# Patient Record
Sex: Male | Born: 1985
Health system: Southern US, Community
[De-identification: ages and names within clinical notes are randomized; demographics above are authoritative.]

---

## 2003-01-12 ENCOUNTER — Emergency Department (HOSPITAL_COMMUNITY): Admission: EM | Admit: 2003-01-12 | Discharge: 2003-01-12 | Payer: Self-pay | Admitting: Emergency Medicine

## 2003-04-12 ENCOUNTER — Emergency Department (HOSPITAL_COMMUNITY): Admission: EM | Admit: 2003-04-12 | Discharge: 2003-04-12 | Payer: Self-pay | Admitting: Emergency Medicine

## 2003-08-10 ENCOUNTER — Emergency Department (HOSPITAL_COMMUNITY): Admission: EM | Admit: 2003-08-10 | Discharge: 2003-08-10 | Payer: Self-pay | Admitting: Emergency Medicine

## 2004-10-11 ENCOUNTER — Ambulatory Visit (HOSPITAL_COMMUNITY): Admission: RE | Admit: 2004-10-11 | Discharge: 2004-10-11 | Payer: Self-pay

## 2005-03-22 ENCOUNTER — Emergency Department (HOSPITAL_COMMUNITY): Admission: EM | Admit: 2005-03-22 | Discharge: 2005-03-22 | Payer: Self-pay | Admitting: *Deleted

## 2007-05-29 ENCOUNTER — Ambulatory Visit (HOSPITAL_COMMUNITY): Admission: RE | Admit: 2007-05-29 | Discharge: 2007-05-29 | Payer: Self-pay | Admitting: Orthopedic Surgery

## 2007-07-20 ENCOUNTER — Emergency Department (HOSPITAL_COMMUNITY): Admission: EM | Admit: 2007-07-20 | Discharge: 2007-07-20 | Payer: Self-pay | Admitting: Emergency Medicine

## 2008-03-27 ENCOUNTER — Emergency Department (HOSPITAL_COMMUNITY): Admission: EM | Admit: 2008-03-27 | Discharge: 2008-03-27 | Payer: Self-pay | Admitting: Emergency Medicine

## 2008-09-10 ENCOUNTER — Emergency Department (HOSPITAL_COMMUNITY): Admission: EM | Admit: 2008-09-10 | Discharge: 2008-09-10 | Payer: Self-pay | Admitting: Emergency Medicine

## 2008-09-13 ENCOUNTER — Ambulatory Visit (HOSPITAL_COMMUNITY): Admission: RE | Admit: 2008-09-13 | Discharge: 2008-09-13 | Payer: Self-pay | Admitting: Orthopedic Surgery

## 2008-09-16 ENCOUNTER — Ambulatory Visit (HOSPITAL_COMMUNITY): Admission: RE | Admit: 2008-09-16 | Discharge: 2008-09-17 | Payer: Self-pay | Admitting: Orthopaedic Surgery

## 2009-04-12 HISTORY — PX: ANKLE SURGERY: SHX546

## 2009-07-16 ENCOUNTER — Emergency Department (HOSPITAL_COMMUNITY): Admission: EM | Admit: 2009-07-16 | Discharge: 2009-07-17 | Payer: Self-pay | Admitting: Emergency Medicine

## 2009-07-16 ENCOUNTER — Ambulatory Visit (HOSPITAL_COMMUNITY): Admission: RE | Admit: 2009-07-16 | Discharge: 2009-07-16 | Payer: Self-pay | Admitting: Family Medicine

## 2010-07-01 LAB — CBC
Hemoglobin: 14.2 g/dL (ref 13.0–17.0)
MCV: 90.9 fL (ref 78.0–100.0)
Platelets: 191 10*3/uL (ref 150–400)
RBC: 4.56 MIL/uL (ref 4.22–5.81)
WBC: 7 10*3/uL (ref 4.0–10.5)

## 2010-07-01 LAB — URINALYSIS, ROUTINE W REFLEX MICROSCOPIC
Bilirubin Urine: NEGATIVE
Glucose, UA: NEGATIVE mg/dL
Ketones, ur: NEGATIVE mg/dL
Protein, ur: NEGATIVE mg/dL
Specific Gravity, Urine: 1.043 — ABNORMAL HIGH (ref 1.005–1.030)
pH: 7.5 (ref 5.0–8.0)

## 2010-07-01 LAB — COMPREHENSIVE METABOLIC PANEL
Albumin: 3.9 g/dL (ref 3.5–5.2)
Alkaline Phosphatase: 53 U/L (ref 39–117)
CO2: 24 mEq/L (ref 19–32)
Calcium: 8.6 mg/dL (ref 8.4–10.5)
GFR calc Af Amer: >60 mL/min (ref >60)
GFR calc non Af Amer: >60 mL/min (ref >60)
Glucose, Bld: 91 mg/dL (ref 70–99)
Sodium: 136 mEq/L (ref 135–145)
Total Bilirubin: 0.4 mg/dL (ref 0.3–1.2)
Total Protein: 6.3 g/dL (ref 6.0–8.3)

## 2010-07-01 LAB — DIFFERENTIAL
Basophils Relative: 0 % (ref 0–1)
Eosinophils Relative: 2 % (ref 0–5)
Lymphs Abs: 2.5 10*3/uL (ref 0.7–4.0)
Neutro Abs: 3.8 10*3/uL (ref 1.7–7.7)

## 2010-07-20 LAB — DIFFERENTIAL
Basophils Absolute: 0 10*3/uL (ref 0.0–0.1)
Basophils Relative: 0 % (ref 0–1)
Eosinophils Relative: 1 % (ref 0–5)
Monocytes Absolute: 0.6 10*3/uL (ref 0.1–1.0)

## 2010-07-20 LAB — COMPREHENSIVE METABOLIC PANEL
ALT: 30 U/L (ref 0–53)
AST: 28 U/L (ref 0–37)
Albumin: 4.1 g/dL (ref 3.5–5.2)
Alkaline Phosphatase: 51 U/L (ref 39–117)
GFR calc Af Amer: >60 mL/min (ref >60)
GFR calc non Af Amer: >60 mL/min (ref >60)
Potassium: 3.9 mEq/L (ref 3.5–5.1)
Sodium: 139 mEq/L (ref 135–145)
Total Bilirubin: 0.6 mg/dL (ref 0.3–1.2)

## 2010-07-20 LAB — PROTIME-INR: Prothrombin Time: 12.7 seconds (ref 11.6–15.2)

## 2010-07-20 LAB — CBC
MCHC: 34.5 g/dL (ref 30.0–36.0)
MCHC: 34.5 g/dL (ref 30.0–36.0)
MCV: 93.3 fL (ref 78.0–100.0)
MCV: 93.4 fL (ref 78.0–100.0)
Platelets: 246 10*3/uL (ref 150–400)
RDW: 12.3 % (ref 11.5–15.5)
RDW: 12.6 % (ref 11.5–15.5)
WBC: 7.3 10*3/uL (ref 4.0–10.5)
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
## 2015-08-16 ENCOUNTER — Emergency Department (HOSPITAL_COMMUNITY)
Admission: EM | Admit: 2015-08-16 | Discharge: 2015-08-16 | Disposition: A | Discharge status: Home / Self Care | Payer: Self-pay | Attending: Emergency Medicine | Admitting: Emergency Medicine

## 2015-08-16 ENCOUNTER — Encounter (HOSPITAL_COMMUNITY): Payer: Self-pay | Admitting: *Deleted

## 2015-08-16 DIAGNOSIS — T1502XA Foreign body in cornea, left eye, initial encounter: Secondary | ICD-10-CM | POA: Insufficient documentation

## 2015-08-16 DIAGNOSIS — Y9389 Activity, other specified: Secondary | ICD-10-CM | POA: Insufficient documentation

## 2015-08-16 DIAGNOSIS — Y9289 Other specified places as the place of occurrence of the external cause: Secondary | ICD-10-CM | POA: Insufficient documentation

## 2015-08-16 DIAGNOSIS — X58XXXA Exposure to other specified factors, initial encounter: Secondary | ICD-10-CM | POA: Insufficient documentation

## 2015-08-16 DIAGNOSIS — T1592XA Foreign body on external eye, part unspecified, left eye, initial encounter: Secondary | ICD-10-CM

## 2015-08-16 DIAGNOSIS — F17210 Nicotine dependence, cigarettes, uncomplicated: Secondary | ICD-10-CM | POA: Insufficient documentation

## 2015-08-16 DIAGNOSIS — Y998 Other external cause status: Secondary | ICD-10-CM | POA: Insufficient documentation

## 2015-08-16 MED ORDER — PROPARACAINE HCL 0.5 % OP SOLN
1.0000 [drp] | Freq: Once | OPHTHALMIC | Status: AC
  Administered 2015-08-16: 1 [drp] via OPHTHALMIC
  Filled 2015-08-16: qty 15

## 2015-08-16 MED ORDER — OFLOXACIN 0.3 % OP SOLN: 1.0000 [drp] | Freq: Four times a day (QID) | OPHTHALMIC | Status: DC

## 2015-08-16 MED ORDER — CYCLOPENTOLATE HCL 1 % OP SOLN: 1.0000 [drp] | Freq: Two times a day (BID) | OPHTHALMIC | Status: DC

## 2015-08-16 MED ORDER — FLUORESCEIN SODIUM 1 MG OP STRP
1.0000 | ORAL_STRIP | Freq: Once | OPHTHALMIC | Status: AC
  Administered 2015-08-16: 1 via OPHTHALMIC
  Filled 2015-08-16: qty 1

## 2015-08-16 MED ORDER — TETRACAINE HCL 0.5 % OP SOLN
1.0000 [drp] | Freq: Once | OPHTHALMIC | Status: AC
Start: 2015-08-16 — End: 2015-08-16
  Administered 2015-08-16: 1 [drp] via OPHTHALMIC
  Filled 2015-08-16: qty 2

## 2015-08-16 MED ORDER — ERYTHROMYCIN 5 MG/GM OP OINT: 1.0000 "application " | TOPICAL_OINTMENT | Freq: Four times a day (QID) | OPHTHALMIC | Status: DC

## 2015-08-16 NOTE — ED Notes (Signed)
Pt refusing visual acuity at this time. States he just wants the metal out.
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
## 2015-08-16 NOTE — ED Notes (Addendum)
Called pt back to room X1 family stated pt stepped outside she would call him.

## 2015-08-16 NOTE — Consult Note (Signed)
Thomas Cunningham                                                                               08/16/2015                                              Ophthalmology Consultation                                         *  Reason for consultation: Metallic foreign body in cornea of left eye  HPI: Metallic foreign body to left eye 2 days prior with increasing eye pain today not alleviated by lubrication  HPI Comments: Thomas Cunningham is a 30 y.o. male who presents to the Emergency Department complaining of a foreign body sensation to the left eye onset one hour ago with associated pain. Pt states he believes his injury was caused while he was removing his safely glasses after welding with his gloved hand. Denies injury to the right eye. He states he was able to see a piece of metal in his eye on self exam and attempted to remove it with a qtip with no success. Pt does not wear contacts or glasses. He denies fever, visual disturbance.    History reviewed. No pertinent past medical history. Past Surgical History  Procedure Laterality Date  . Ankle surgery Right 2011   History reviewed. No pertinent family history. Social History  Substance Use Topics  . Smoking status: Current Every Day Smoker -- 1.00 packs/day    Types: Cigarettes  . Smokeless tobacco: Never Used  . Alcohol Use: 0.6 oz/week    1 Cans of beer per week     Comment: 2-3 beers a weeek    Review of Systems  Constitutional: Negative for fever.  Eyes: Positive for photophobia and pain (left eye). Negative for visual disturbance.   +foreign body sensation to left eye persistent and not improved over the past day  Skin: Negative for rash and wound.  Neurological: Negative for headaches.   Allergies  Review of patient's allergies indicates not on file.  Home Medications  Patient is to take Cyclogyl 1% BID, Ofloxacin 0.3% QID, and Erythromycin ointment TID left eye and follow up with Dr.  [name] next Tuesday at 4:45 in his office at [facility]. [address] [place], [place] [address]                                 BP 117/67 mmHg  Pulse 87  Temp(Src) 98 F (36.7 C) (Oral)  Resp 18  SpO2 98% Physical Exam  Constitutional: He appears well-developed and well-nourished. No distress.  Nontoxic appearing.  HENT:  Head: Normocephalic and atraumatic.  Right Ear: External ear normal.  Left Ear: External ear normal.  Eyes: EOM are normal. Pupils are equal, round, and reactive to light. Right eye exhibits no discharge. Left eye exhibits no discharge.  Foreign body present in the left eye. Left conjunctiva is 2-3+ injected.  Slit lamp exam:  Normal exam right eye Left Eye:  Cornea - metallic foreign body with rust ring at 2 o'clock 2mm from visual axis. Iris, lens, vitreous, AC normal OS  Retina - 0.2 C/D OU, normal macula OU No evidence of AC cell or vitreous cell.  Using a 27 gauge needle, a large portion of the foreign body and rust ring were removed.  A corneal burr was used to remove the superficial aspect of the rust ring.  The rust ring was deep and additional removal was not attempted due to risk of perforation.  Pulmonary/Chest: Effort normal. No respiratory distress.  Neurological: He is alert. Coordination normal.  Skin: Skin is warm and dry. No rash noted. He is not diaphoretic. No erythema. No pallor.  Psychiatric: He has a normal mood and affect. His behavior is normal.  Nursing note and vitals reviewed.  Visual Acuity  Right Eye Distance:20/20 Left Eye Distance:20/25 Bilateral Distance:20/40       .Foreign Body Removal Procedure note: Date/Time: 08/16/2015 7:15 PM Performed by: Carmela RimaNarendra Adella Manolis, MD   Consent: Verbal consent obtained. Risks and benefits: risks, benefits and alternatives were discussed Consent given by: patient Patient understanding: patient states understanding of the procedure  being performed Patient consent: the patient's understanding of the procedure matches consent given Procedure consent: procedure consent matches procedure scheduled Relevant documents: relevant documents present and verified Site marked: the operative site was marked Required items: required blood products, implants, devices, and special equipment available Patient identity confirmed: verbally with patient Time out: Immediately prior to procedure a "time out" was called to verify the correct patient, procedure, equipment, support staff and site/side marked as required. Body area: eye Location details: left cornea Local anesthetic: proparicaine drops Anesthetic total: 2 drops Patient sedated: no Patient restrained: no Localization method: visualized Removal mechanism: 27 gauge needle, corneal burr, and cotton swab with irrigation Eye examined with fluorescein. Fluorescein uptake. Corneal abrasion size: small Corneal abrasion location: central Residual rust ring present. 1 objects recovered. Objects recovered: Metal foreign body Post-procedure assessment: residual rust ring remain Patient tolerance: Patient tolerated the procedure well with no immediate complications Comments: One small metal foreign body removed by me. I attempted to remove second foreign body without success.   (including critical care time) DIAGNOSTIC STUDIES: Oxygen Saturation is 99% on RA, normal by my interpretation.    MDM   Meds given in ED:  Medications  fluorescein ophthalmic strip 1 strip (1 strip Both Eyes Given 08/16/15 1922)  tetracaine (PONTOCAINE) 0.5 % ophthalmic solution 1 drop (1 drop Both Eyes Given 08/16/15 1922)  proparacaine (ALCAINE) 0.5 % ophthalmic solution 1 drop (1 drop Both Eyes Given 08/16/15 2025)    Discharge Medication List as of 08/16/2015 9:11 PM    START taking these medications   Details  cyclopentolate (CYCLOGYL) 1 % ophthalmic solution Place 1-2 drops into  the left eye 2 (two) times daily., Starting 08/16/2015, Until Discontinued, Print    erythromycin ophthalmic ointment Place 1 application into the left eye every 6 (six) hours. Place 1/2 inch ribbon of ointment in the affected eye 4 times a day, Starting 08/16/2015, Until Discontinued, Print    ofloxacin (OCUFLOX) 0.3 % ophthalmic solution Place 1 drop into the left eye 4 (four) times daily., Starting 08/16/2015, Until Discontinued, Print         Final diagnoses:  Eye foreign body, left, initial encounter   This is a 30  y.o. male who presents to the Emergency Department complaining of a foreign body sensation to the left eye onset one hour ago with associated pain. Pt states he believes his injury was caused while he was removing his safely glasses after welding with his gloved hand. Denies injury to the right eye. He states he was able to see a piece of metal in his eye on self exam and attempted to remove it with a qtip with no success.  Dr. [name] used a corenal  Burr and 27 gauge needle to remove the eye foreign body. He requested the patient be placed on cyclogyl, erythromycin ointment and ofloxacin ointment. The patient will follow up with me in 3 days in my office.           Harrold Donath

## 2015-08-16 NOTE — ED Notes (Signed)
PT reports earlier today he had a piece on metal in his LT eye.

## 2015-08-16 NOTE — ED Provider Notes (Signed)
CSN: [id number]     Arrival date & time 08/16/15  1805 History  By signing my name below, I, Thomas Cunningham, attest that this documentation has been prepared under the direction and in the presence of Thomas Farrier, PA-C. Electronically Signed: Doreatha Cunningham, ED Scribe. 08/16/2015. 7:40 PM.    Chief Complaint  Patient presents with  . Eye Injury   The history is provided by the patient. No language interpreter was used.   HPI Comments: Thomas Cunningham is a 30 y.o. male who presents to the Emergency Department complaining of a foreign body sensation to the left eye onset one hour ago with associated pain. Pt states he believes his injury was caused while he was removing his safely glasses after welding with his gloved hand. Denies injury to the right eye. He states he was able to see a piece of metal in his eye on self exam and attempted to remove it with a qtip with no success. Pt does not wear contacts or glasses. He denies fever, visual disturbance.    History reviewed. No pertinent past medical history. Past Surgical History  Procedure Laterality Date  . Ankle surgery Right 2011   History reviewed. No pertinent family history. Social History  Substance Use Topics  . Smoking status: Current Every Day Smoker -- 1.00 packs/day    Types: Cigarettes  . Smokeless tobacco: Never Used  . Alcohol Use: 0.6 oz/week    1 Cans of beer per week     Comment: 2-3 beers a weeek    Review of Systems  Constitutional: Negative for fever.  Eyes: Positive for photophobia and pain (left eye). Negative for visual disturbance.       +foreign body sensation to left eye  Skin: Negative for rash and wound.  Neurological: Negative for headaches.   Allergies  Review of patient's allergies indicates not on file.  Home Medications   Prior to Admission medications   Medication Sig Start Date End Date Taking? Authorizing Provider  cyclopentolate (CYCLOGYL) 1 % ophthalmic solution Place 1-2 drops into the left  eye 2 (two) times daily. 08/16/15   Thomas Farrier, PA-C  erythromycin ophthalmic ointment Place 1 application into the left eye every 6 (six) hours. Place 1/2 inch ribbon of ointment in the affected eye 4 times a day 08/16/15   Thomas Farrier, PA-C  ofloxacin (OCUFLOX) 0.3 % ophthalmic solution Place 1 drop into the left eye 4 (four) times daily. 08/16/15   Thomas Farrier, PA-C   BP 117/67 mmHg  Pulse 87  Temp(Src) 98 F (36.7 C) (Oral)  Resp 18  SpO2 98% Physical Exam  Constitutional: He appears well-developed and well-nourished. No distress.  Nontoxic appearing.  HENT:  Head: Normocephalic and atraumatic.  Right Ear: External ear normal.  Left Ear: External ear normal.  Eyes: EOM are normal. Pupils are equal, round, and reactive to light. Right eye exhibits no discharge. Left eye exhibits no discharge. Foreign body present in the left eye. Left conjunctiva is injected.  Slit lamp exam:      The left eye shows foreign body. The left eye shows no corneal abrasion, no corneal flare, no corneal ulcer, no hyphema, no hypopyon and no fluorescein uptake.  Left conjunctiva mildly injected. EOM intact bilaterally. No sidel's sign. Vision grossly intact.  Evidence of foreign body embedded over pupil of left eye. With further examination after anesthesia there appears to be 2 small metal foreign bodies. One was removed by me, the other by ophthalmologist Dr. [name].  Pulmonary/Chest: Effort normal. No respiratory distress.  Neurological: He is alert. Coordination normal.  Skin: Skin is warm and dry. No rash noted. He is not diaphoretic. No erythema. No pallor.  Psychiatric: He has a normal mood and affect. His behavior is normal.  Nursing note and vitals reviewed.    Visual Acuity  Right Eye Distance: 20/20 Left Eye Distance: 20/25 Bilateral Distance: 20/40  Right Eye Near:   Left Eye Near:    Bilateral Near:      ED Course  .Foreign Body Removal Date/Time: 08/16/2015 7:15 PM Performed by:  Thomas Cunningham Authorized by: Thomas Cunningham Consent: Verbal consent obtained. Risks and benefits: risks, benefits and alternatives were discussed Consent given by: patient Patient understanding: patient states understanding of the procedure being performed Patient consent: the patient's understanding of the procedure matches consent given Procedure consent: procedure consent matches procedure scheduled Relevant documents: relevant documents present and verified Site marked: the operative site was marked Required items: required blood products, implants, devices, and special equipment available Patient identity confirmed: verbally with patient Time out: Immediately prior to procedure a "time out" was called to verify the correct patient, procedure, equipment, support staff and site/side marked as required. Body area: eye Location details: left cornea Local anesthetic: tetracaine drops Anesthetic total: 2 drops Patient sedated: no Patient restrained: no Localization method: visualized Removal mechanism: moist cotton swab and irrigation Eye examined with fluorescein. Fluorescein uptake. Corneal abrasion size: small Corneal abrasion location: central Residual rust ring present. 1 objects recovered. Objects recovered: Metal foreign body Post-procedure assessment: residual foreign bodies remain Patient tolerance: Patient tolerated the procedure well with no immediate complications Comments: One small metal foreign body removed by me. I attempted to remove second foreign body without success.    (including critical care time) DIAGNOSTIC STUDIES: Oxygen Saturation is 99% on RA, normal by my interpretation.    COORDINATION OF CARE: 7:36 PM Discussed treatment plan with pt at bedside which includes ophthalmology f/u and pt agreed to plan.   7:38 PM Discussed case with Dr. Denton Lank, who agrees to come evaluate the pt.     8:04 PM Consulted with ophthalmologist, Dr. [name], who will come  evaluate the pt.    MDM   Meds given in ED:  Medications  fluorescein ophthalmic strip 1 strip (1 strip Both Eyes Given 08/16/15 1922)  tetracaine (PONTOCAINE) 0.5 % ophthalmic solution 1 drop (1 drop Both Eyes Given 08/16/15 1922)  proparacaine (ALCAINE) 0.5 % ophthalmic solution 1 drop (1 drop Both Eyes Given 08/16/15 2025)    Discharge Medication List as of 08/16/2015  9:11 PM    START taking these medications   Details  cyclopentolate (CYCLOGYL) 1 % ophthalmic solution Place 1-2 drops into the left eye 2 (two) times daily., Starting 08/16/2015, Until Discontinued, Print    erythromycin ophthalmic ointment Place 1 application into the left eye every 6 (six) hours. Place 1/2 inch ribbon of ointment in the affected eye 4 times a day, Starting 08/16/2015, Until Discontinued, Print    ofloxacin (OCUFLOX) 0.3 % ophthalmic solution Place 1 drop into the left eye 4 (four) times daily., Starting 08/16/2015, Until Discontinued, Print          Final diagnoses:  Eye foreign body, left, initial encounter   This is a 30 y.o. male who presents to the Emergency Department complaining of a foreign body sensation to the left eye onset one hour ago with associated pain. Pt states he believes his injury was caused while he was removing his  safely glasses after welding with his gloved hand. Denies injury to the right eye. He states he was able to see a piece of metal in his eye on self exam and attempted to remove it with a qtip with no success.  On exam the patient appears to have 2 small foreign bodies. The first foreign body was removed by me with moist Q-tip. The second foreign body, which is centrally located and appears to be embedded, was unable to be removed by myself. Dr. Denton LankSteinl attempted removal with a needle and was also unsuccessful.   I consulted with ophthalmologist Dr. Allena KatzPatel was by to see the patient and used an eye bur to remove the eye foreign body. He requested the patient be placed on cyclogyl,  erythromycin ointment and ofloxacin ointment. The patient will follow up with him in 3 days in his office.  I advised the patient to follow-up with their primary care provider this week. I advised the patient to return to the emergency department with new or worsening symptoms or new concerns. The patient verbalized understanding and agreement with plan.    I personally performed the services described in this documentation, which was scribed in my presence. The recorded information has been reviewed and is accurate.      Thomas FarrierWilliam Frederico Gerling, PA-C 08/16/15 2154  Cathren LaineKevin Steinl, MD 08/17/15 [phone]

## 2015-08-16 NOTE — Discharge Instructions (Signed)
Eye Foreign Body  A foreign body refers to any object on the surface of the eye or in the eyeball that should not be there. A foreign body may be a small speck of dirt or dust, a hair or eyelash, a splinter, or any other object.   SIGNS AND SYMPTOMS  Symptoms depend on what the foreign body is and where it is in the eye. The most common locations are:    On the inner surface of the upper or lower eyelids or on the covering of the white part of the eye (conjunctiva). Symptoms in this location are:    Pain and irritation, especially when blinking.    The feeling that something is in the eye.   On the surface of the clear covering on the front of the eye (cornea). Symptoms in this location include:    Pain and irritation.     Small "rust rings" around a metallic foreign body.    The feeling that something is in the eye.    Inside the eyeball. Foreign bodies inside the eye may cause:     Great pain.     Immediate loss of vision.     Distortion of the pupil.  DIAGNOSIS   Foreign bodies are found during an exam by an eye specialist. Those on the eyelids, conjunctiva, or cornea are usually (but not always) easily found. When a foreign body is inside the eyeball, a cloudiness of the lens (cataract) may form almost right away. This makes it hard for an eye specialist to find the foreign body. Tests may be needed, including ultrasound testing, X-rays, and CT scans.  TREATMENT    Foreign bodies on the eyelids, conjunctiva, or cornea are often removed easily and painlessly.   Rust in the cornea may require the use of a drill-like instrument to remove the rust.   If the foreign body has caused a scratch or a rubbing or scraping (abrasion) of the cornea, this may be treated with antibiotic drops or ointment. A pressure patch may be put over your eye.   If the foreign body is inside your eyeball, surgery is needed right away. This is a medical emergency. Foreign bodies inside the eye threaten vision. A person may even  lose his or her eye.  HOME CARE INSTRUCTIONS    Take medicines only as directed by your health care provider. Use eye drops or ointment as directed.   If no eye patch was applied:    Keep your eye closed as much as possible.    Do not rub your eye.    Wear dark glasses as needed to protect your eyes from bright light.    Do not wear contact lenses until your eye feels normal again, or as instructed by your health care provider.    Wear a protective eye covering if there is a risk of eye injury. This is important when working with high-speed tools.   If your eye is patched:    Follow your health care provider's instructions for when to remove the patch.    Do notdrive or operate machinery if your eye is patched. Your ability to judge distances is impaired.   Keep all follow-up visits as directed by your health care provider. This is important.  SEEK MEDICAL CARE IF:    You have increased pain in your eye.   Your vision gets worse.    You have problems with your eye patch.    You have fluid (discharge)   coming from your injured eye.    You have redness and swelling around your affected eye.   MAKE SURE YOU:    Understand these instructions.   Will watch your condition.   Will get help right away if you are not doing well or get worse.     This information is not intended to replace advice given to you by your health care provider. Make sure you discuss any questions you have with your health care provider.     Document Released: 03/29/2005 Document Revised: 04/19/2014 Document Reviewed: 08/24/2012  Elsevier Interactive Patient Education 2016 [employer]

## 2016-05-26 ENCOUNTER — Ambulatory Visit (INDEPENDENT_AMBULATORY_CARE_PROVIDER_SITE_OTHER): Payer: Commercial Managed Care - PPO | Admitting: Physician Assistant

## 2016-05-26 VITALS — BP 122/80 | HR 76 | Temp 98.4°F | Resp 16 | Ht 70.0 in | Wt 184.0 lb

## 2016-05-26 DIAGNOSIS — B349 Viral infection, unspecified: Secondary | ICD-10-CM | POA: Diagnosis not present

## 2016-05-26 DIAGNOSIS — R0981 Nasal congestion: Secondary | ICD-10-CM

## 2016-05-26 DIAGNOSIS — R110 Nausea: Secondary | ICD-10-CM

## 2016-05-26 DIAGNOSIS — R059 Cough, unspecified: Secondary | ICD-10-CM

## 2016-05-26 DIAGNOSIS — R05 Cough: Secondary | ICD-10-CM

## 2016-05-26 MED ORDER — MUCINEX DM MAXIMUM STRENGTH 60-1200 MG PO TB12: 1.0000 | ORAL_TABLET | Freq: Two times a day (BID) | ORAL | 1 refills | Status: AC

## 2016-05-26 MED ORDER — FLUTICASONE PROPIONATE 50 MCG/ACT NA SUSP: 2.0000 | Freq: Every day | NASAL | 6 refills | Status: AC

## 2016-05-26 MED ORDER — BENZONATATE 100 MG PO CAPS: 100.0000 mg | ORAL_CAPSULE | Freq: Three times a day (TID) | ORAL | 0 refills | Status: AC | PRN

## 2016-05-26 MED ORDER — HYDROCODONE-HOMATROPINE 5-1.5 MG/5ML PO SYRP: 5.0000 mL | ORAL_SOLUTION | Freq: Three times a day (TID) | ORAL | 0 refills | Status: AC | PRN

## 2016-05-26 NOTE — Patient Instructions (Addendum)
Flonase - 2 sprays each nostril at night before bed. You may use this during the day if needed.  Mucinex - please be sure to drink plenty of water with this.  Warm tea with honey will help sore throat.  Hycodan is a controlled substance - please use this as directed.  Tessalon is for daytime cough.  Come back in 5-7 days if you are not better.    Thank you for coming in today. I hope you feel we met your needs.  Feel free to call [facility] if you have any questions or further requests.  Please consider signing up for MyChart if you do not already have it, as this is a great way to communicate with me.  Best,  Whitney McVey, PA-C  IF you received an x-ray today, you will receive an invoice from [facility]. Please contact [facility] at [phone] with questions or concerns regarding your invoice.   IF you received labwork today, you will receive an invoice from [facility]. Please contact [facility] at [phone] with questions or concerns regarding your invoice.   Our billing staff will not be able to assist you with questions regarding bills from these companies.  You will be contacted with the lab results as soon as they are available. The fastest way to get your results is to activate your My Chart account. Instructions are located on the last page of this paperwork. If you have not heard from [place] regarding the results in 2 weeks, please contact this office.

## 2016-05-26 NOTE — Progress Notes (Signed)
   Ihor AustinDaniel B Wierzba  MRN: [id number] DOB: 06/03/1985  PCP: No PCP Per Patient  Subjective:  Pt is a 31 year old male no reported PMH who presents to clinic for cough, fatigue and nausea 5 days.   C/o achy, tired, nausea, nasal congestion, chills, productive cough. Temp yesterday 98. Not sleeping well due to congestion. Cough is worse in the morning.  He is taking NyQuil and DayQuil, helping some.  Did not get flu shot this season. Unknown sick contacts.  Denies chest pain, vomiting, palpitations, headache, ear pain, fever.   Review of Systems  Constitutional: Positive for fatigue. Negative for chills and diaphoresis.  Respiratory: Positive for cough. Negative for chest tightness, shortness of breath and wheezing.   Cardiovascular: Negative for chest pain and palpitations.  Gastrointestinal: Positive for nausea. Negative for diarrhea and vomiting.  Neurological: Negative for dizziness, syncope, light-headedness and headaches.  Psychiatric/Behavioral: Negative for sleep disturbance. The patient is not nervous/anxious.     There are no active problems to display for this patient.   No current outpatient prescriptions on file prior to visit.   No current facility-administered medications on file prior to visit.     No Known Allergies   Objective:  BP 122/80 (BP Location: Right Arm, Patient Position: Sitting, Cuff Size: Normal)   Pulse 76   Temp 98.4 F (36.9 C) (Oral)   Resp 16   Ht 5\' 10"  (1.778 m)   Wt 184 lb (83.5 kg)   SpO2 99%   BMI 26.40 kg/m   Physical Exam  Constitutional: He is oriented to person, place, and time and well-developed, well-nourished, and in no distress. No distress.  HENT:  Nose: Mucosal edema present. No rhinorrhea. Right sinus exhibits no maxillary sinus tenderness and no frontal sinus tenderness. Left sinus exhibits no maxillary sinus tenderness and no frontal sinus tenderness.  Mouth/Throat: Mucous membranes are normal. Posterior oropharyngeal edema  present. No oropharyngeal exudate or posterior oropharyngeal erythema.  B/l cerumen impaction   Cardiovascular: Normal rate, regular rhythm and normal heart sounds.   Pulmonary/Chest: Effort normal. He has no wheezes. He has no rales.  Neurological: He is alert and oriented to person, place, and time. GCS score is 15.  Skin: Skin is warm and dry.  Psychiatric: Mood, memory, affect and judgment normal.  Vitals reviewed.   Assessment and Plan :  1. Viral illness 2. Cough 3. Nasal congestion 4. Nausea without vomiting - HYDROcodone-homatropine (HYCODAN) 5-1.5 MG/5ML syrup; Take 5 mLs by mouth every 8 (eight) hours as needed for cough.  Dispense: 120 mL; Refill: 0 - benzonatate (TESSALON) 100 MG capsule; Take 1-2 capsules (100-200 mg total) by mouth 3 (three) times daily as needed for cough.  Dispense: 40 capsule; Refill: 0 - fluticasone (FLONASE) 50 MCG/ACT nasal spray; Place 2 sprays into both nostrils daily.  Dispense: 16 g; Refill: 6 - Dextromethorphan-Guaifenesin (MUCINEX DM MAXIMUM STRENGTH) 60-1200 MG TB12; Take 1 tablet by mouth every 12 (twelve) hours.  Dispense: 14 each; Refill: 1 - Suspect viral illness. Supportive care: Push fluids, rest, cough suppression. RTC in 5-7 days if no improvement.    Marco CollieWhitney Jala Dundon, PA-C  [facility] at [facility] 05/26/2016 2:16 PM

## 2017-06-11 ENCOUNTER — Emergency Department (HOSPITAL_COMMUNITY)
Admission: EM | Admit: 2017-06-11 | Discharge: 2017-06-11 | Discharge status: Against Medical Advice | Payer: Commercial Managed Care - PPO | Attending: Emergency Medicine | Admitting: Emergency Medicine

## 2017-06-11 ENCOUNTER — Other Ambulatory Visit: Payer: Self-pay

## 2017-06-11 ENCOUNTER — Encounter (HOSPITAL_COMMUNITY): Payer: Self-pay

## 2017-06-11 DIAGNOSIS — T1591XA Foreign body on external eye, part unspecified, right eye, initial encounter: Secondary | ICD-10-CM | POA: Diagnosis not present

## 2017-06-11 DIAGNOSIS — Z87891 Personal history of nicotine dependence: Secondary | ICD-10-CM | POA: Diagnosis not present

## 2017-06-11 DIAGNOSIS — W208XXA Other cause of strike by thrown, projected or falling object, initial encounter: Secondary | ICD-10-CM | POA: Diagnosis not present

## 2017-06-11 DIAGNOSIS — Y9389 Activity, other specified: Secondary | ICD-10-CM | POA: Insufficient documentation

## 2017-06-11 DIAGNOSIS — Z5320 Procedure and treatment not carried out because of patient's decision for unspecified reasons: Secondary | ICD-10-CM | POA: Diagnosis not present

## 2017-06-11 DIAGNOSIS — Y990 Civilian activity done for income or pay: Secondary | ICD-10-CM | POA: Insufficient documentation

## 2017-06-11 DIAGNOSIS — H5711 Ocular pain, right eye: Secondary | ICD-10-CM | POA: Diagnosis present

## 2017-06-11 DIAGNOSIS — Y9289 Other specified places as the place of occurrence of the external cause: Secondary | ICD-10-CM | POA: Insufficient documentation

## 2017-06-11 MED ORDER — TETRACAINE HCL 0.5 % OP SOLN
1.0000 [drp] | Freq: Once | OPHTHALMIC | Status: DC
  Filled 2017-06-11: qty 4

## 2017-06-11 MED ORDER — FLUORESCEIN SODIUM 1 MG OP STRP
1.0000 | ORAL_STRIP | Freq: Once | OPHTHALMIC | Status: DC
  Filled 2017-06-11: qty 1

## 2017-06-11 NOTE — ED Notes (Signed)
Bed: WTR7 Expected date:  Expected time:  Means of arrival:  Comments: [place]

## 2017-06-11 NOTE — ED Notes (Signed)
Patient no longer in room. PA attempted to call patients number with no response.

## 2017-06-11 NOTE — ED Provider Notes (Addendum)
[facility]-EMERGENCY DEPT Provider Note   CSN: [id number] Arrival date & time: 06/11/17  1454     History   Chief Complaint Chief Complaint  Patient presents with  . Eye Pain    HPI Thomas Cunningham is a 32 y.o. male.  HPI   Patient is a 32 year old male with no significant past medical history presenting for foreign body sensation in the right eye.  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  Debris was blowing from a fan and he said that he felt something penetrate his right eye despite wearing safety glasses.  Patient reports that he copiously irrigated his right eye, but woke up this morning with  clear drainage from the right eye.  Patient reports that he has had a foreign body in his eye before from welding.  Patient reports that tetanus shot is up-to-date.  Patient reports slightly blurred vision, but otherwise no disturbance of vision.  History reviewed. No pertinent past medical history.  There are no active problems to display for this patient.   Past Surgical History:  Procedure Laterality Date  . ANKLE SURGERY Right 2011       Home Medications    Prior to Admission medications   Medication Sig Start Date End Date Taking? Authorizing Provider  benzonatate (TESSALON) 100 MG capsule Take 1-2 capsules (100-200 mg total) by mouth 3 (three) times daily as needed for cough. 05/26/16   McVey, Madelaine BhatElizabeth Whitney, PA-C  Dextromethorphan-Guaifenesin (MUCINEX DM MAXIMUM STRENGTH) 60-1200 MG TB12 Take 1 tablet by mouth every 12 (twelve) hours. 05/26/16   McVey, Madelaine BhatElizabeth Whitney, PA-C  fluticasone (FLONASE) 50 MCG/ACT nasal spray Place 2 sprays into both nostrils daily. 05/26/16   McVey, Madelaine BhatElizabeth Whitney, PA-C  HYDROcodone-homatropine (HYCODAN) 5-1.5 MG/5ML syrup Take 5 mLs by mouth every 8 (eight) hours as needed for cough. 05/26/16   McVey, Madelaine BhatElizabeth Whitney, PA-C  Pseudoeph-Doxylamine-DM-APAP  (NYQUIL PO) Take by mouth.    [provider]  Pseudoephedrine-APAP-DM (DAYQUIL PO) Take by mouth.    [provider]    Family History History reviewed. No pertinent family history.  Social History Social History   Tobacco Use  . Smoking status: Former Smoker    Packs/day: 1.00    Types: Cigarettes  . Smokeless tobacco: Never Used  Substance Use Topics  . Alcohol use: Yes    Alcohol/week: 0.6 oz    Types: 1 Cans of beer per week    Comment: 2-3 beers a weeek  . Drug use: Not on file     Allergies   Patient has no known allergies.   Review of Systems Review of Systems  Eyes: Positive for photophobia, discharge, redness and visual disturbance.  Gastrointestinal: Negative for nausea and vomiting.     Physical Exam Updated Vital Signs BP 133/86 (BP Location: Right Arm)   Pulse 88   Temp 97.8 F (36.6 C) (Oral)   Resp 16   Ht 5\' 11"  (1.803 m)   Wt 83.9 kg (185 lb)   SpO2 98%   BMI 25.80 kg/m   Physical Exam  Constitutional: He appears well-developed and well-nourished. No distress.  Sitting comfortably in bed.  HENT:  Head: Normocephalic and atraumatic.  Right Eye Exam: No periorbital edema or erythema. Minimal scleral injection. Conjunctival erythema w/o chemosis. No foreign bodies identified on lid eversion. PERRL. EOMI and no pain with extraocular movements.  To visual examination to the naked eye, there  is a identified foreign body without breast ring formation around the 7 o'clock position of the cornea. No periorbital edema or erythema.   Eyes: Conjunctivae are normal. Right eye exhibits no discharge. Left eye exhibits no discharge.  EOMs normal to gross examination.  Neck: Normal range of motion.  Cardiovascular: Normal rate and regular rhythm.  Intact, 2+ radial pulse.  Pulmonary/Chest:  Normal respiratory effort. Patient converses comfortably. No audible wheeze or stridor.  Abdominal: He exhibits no distension.  Musculoskeletal:  Normal range of motion.  Neurological: He is alert.  Cranial nerves intact to gross observation. Patient moves extremities without difficulty.  Skin: Skin is warm and dry. He is not diaphoretic.  Psychiatric: He has a normal mood and affect. His behavior is normal. Judgment and thought content normal.  Nursing note and vitals reviewed.    Visual Acuity  Right Eye Distance: 20/30 uncorrected Left Eye Distance: 20/20 uncorrected Bilateral Distance: 20/25 uncorrected  Right Eye Near:   Left Eye Near:    Bilateral Near:      ED Treatments / Results  Labs (all labs ordered are listed, but only abnormal results are displayed) Labs Reviewed - No data to display  EKG  EKG Interpretation None       Radiology No results found.  Procedures Procedures (including critical care time)  Medications Ordered in ED Medications  fluorescein ophthalmic strip 1 strip (not administered)  tetracaine (PONTOCAINE) 0.5 % ophthalmic solution 1 drop (not administered)     Initial Impression / Assessment and Plan / ED Course  I have reviewed the triage vital signs and the nursing notes.  Pertinent labs & imaging results that were available during my care of the patient were reviewed by me and considered in my medical decision making (see chart for details).     Patient exhibits possible foreign body with no obvious rust ring development over cornea of right eye. Will perform slit lamp examination, and attempt removal with Q-tip.  6:17 PM Patient confirmed to have left AGAINST MEDICAL ADVICE.  Went to evaluate patient and perform examination as well as attempt removal, and patient was not in the room.  I called the patient who did not pick up.  Unable to confirm with the phone number with his personal phone, so did not leave message with any personal instructions.  Patient was aware prior to elopement that further evaluation was needed.  This is a supervised visit with Dr. Tilden Fossa.  Evaluation, management, and discharge planning discussed with this attending physician.  Final Clinical Impressions(s) / ED Diagnoses   Final diagnoses:  Foreign body of right eye, initial encounter    ED Discharge Orders    None       Delia Chimes 06/11/17 1819    Delia Chimes 06/11/17 1819    Tilden Fossa, MD 06/12/17 [phone]

## 2017-06-11 NOTE — ED Triage Notes (Signed)
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 Pt was wearing safety glasses, but debris flew underneath glasses. Pt immediately washed face and flushed out eye. Still irritated today with some drainage today to the right eye. Denies changes to vision.

## 2020-08-11 ENCOUNTER — Emergency Department (HOSPITAL_COMMUNITY)
Admission: EM | Admit: 2020-08-11 | Discharge: 2020-08-11 | Disposition: A | Discharge status: Home / Self Care | Payer: 59 | Attending: Emergency Medicine | Admitting: Emergency Medicine

## 2020-08-11 ENCOUNTER — Encounter (HOSPITAL_COMMUNITY): Payer: Self-pay | Admitting: Pharmacy Technician

## 2020-08-11 ENCOUNTER — Other Ambulatory Visit: Payer: Self-pay

## 2020-08-11 DIAGNOSIS — Z87891 Personal history of nicotine dependence: Secondary | ICD-10-CM | POA: Diagnosis not present

## 2020-08-11 DIAGNOSIS — E860 Dehydration: Secondary | ICD-10-CM

## 2020-08-11 DIAGNOSIS — R1110 Vomiting, unspecified: Secondary | ICD-10-CM | POA: Diagnosis present

## 2020-08-11 DIAGNOSIS — R000 Tachycardia, unspecified: Secondary | ICD-10-CM | POA: Insufficient documentation

## 2020-08-11 DIAGNOSIS — R197 Diarrhea, unspecified: Secondary | ICD-10-CM | POA: Diagnosis not present

## 2020-08-11 LAB — COMPREHENSIVE METABOLIC PANEL
ALT: 20 U/L (ref 0–44)
AST: 22 U/L (ref 15–41)
Albumin: 4.3 g/dL (ref 3.5–5.0)
Alkaline Phosphatase: 49 U/L (ref 38–126)
Anion gap: 12 (ref 5–15)
BUN: 18 mg/dL (ref 6–20)
CO2: 30 mmol/L (ref 22–32)
Calcium: 9.6 mg/dL (ref 8.9–10.3)
Chloride: 91 mmol/L — ABNORMAL LOW (ref 98–111)
Creatinine, Ser: 1.22 mg/dL (ref 0.61–1.24)
GFR, Estimated: >60 mL/min (ref >60)
Glucose, Bld: 129 mg/dL — ABNORMAL HIGH (ref 70–99)
Potassium: 3.7 mmol/L (ref 3.5–5.1)
Sodium: 133 mmol/L — ABNORMAL LOW (ref 135–145)
Total Bilirubin: 0.8 mg/dL (ref 0.3–1.2)
Total Protein: 7.4 g/dL (ref 6.5–8.1)

## 2020-08-11 LAB — LIPASE, BLOOD: Lipase: 32 U/L (ref 11–51)

## 2020-08-11 LAB — CBC
HCT: 52.9 % — ABNORMAL HIGH (ref 39.0–52.0)
Hemoglobin: 18 g/dL — ABNORMAL HIGH (ref 13.0–17.0)
MCH: 29.7 pg (ref 26.0–34.0)
MCHC: 34 g/dL (ref 30.0–36.0)
MCV: 87.1 fL (ref 80.0–100.0)
Platelets: 296 10*3/uL (ref 150–400)
RBC: 6.07 MIL/uL — ABNORMAL HIGH (ref 4.22–5.81)
RDW: 12 % (ref 11.5–15.5)
WBC: 10 10*3/uL (ref 4.0–10.5)
nRBC: 0 % (ref 0.0–0.2)

## 2020-08-11 MED ORDER — ONDANSETRON 4 MG PO TBDP
4.0000 mg | ORAL_TABLET | Freq: Once | ORAL | Status: AC | PRN
  Administered 2020-08-11: 4 mg via ORAL
  Filled 2020-08-11: qty 1

## 2020-08-11 NOTE — ED Notes (Addendum)
edp went into the room to discharge the pt. Pt was no longer in the room without notifying nursing staff.

## 2020-08-11 NOTE — ED Triage Notes (Signed)
Pt here with reports of vomiting X3 days. States unsure if possible food poisoning. Denies fevers, does endorse chills. States he did have diarrhea several days ago.

## 2020-08-11 NOTE — ED Provider Notes (Signed)
[facility] EMERGENCY DEPARTMENT Provider Note   CSN: [id number] Arrival date & time: 08/11/20  1505     History Chief Complaint  Patient presents with  . Emesis    Thomas Cunningham is a 35 y.o. male.  Patient is a healthy 35 year old male presenting today with now 4 days of emesis.  Patient reports on the very first day which was early Thursday morning he had an episode of diarrhea but none since that time.  Thursday evening he had multiple episodes of vomiting which he thought was maybe around 10-20.  This has occurred every evening Friday, Saturday and Sunday.  The last emesis he had was around 530 this morning.  He reports every time he lays down is when he has to vomit.  Throughout the day he is okay.  He can eat solids and drink some fluid but if he tries to drink a large amount of fluid it comes right back up.  He has had no hematemesis.  He has no abdominal pain at this time.  No prior abdominal surgeries.  He was nauseated when he arrived but they gave him ODT Zofran which she reports has resolved his nausea.  He was trying to drink large amounts of fluid and then every time reported vomiting within 10 to 15 minutes of doing that.  He denies any dysuria or flank pain.  No known sick contacts.  Prior to his symptoms starting he did eat a fast food meal but it tasted normal.  No recent antibiotics or overseas travel.  The history is provided by the patient.  Emesis Severity:  Moderate Duration:  4 days Timing:  Intermittent Quality:  Stomach contents Progression:  Unchanged Chronicity:  New Recent urination:  Decreased Relieved by:  None tried Worsened by:  Liquids Ineffective treatments:  None tried Associated symptoms: diarrhea   Associated symptoms: no abdominal pain, no cough, no fever, no headaches and no myalgias   Risk factors: no alcohol use, no prior abdominal surgery, no sick contacts and no travel to endemic areas        History reviewed. No  pertinent past medical history.  There are no problems to display for this patient.   Past Surgical History:  Procedure Laterality Date  . ANKLE SURGERY Right 2011       No family history on file.  Social History   Tobacco Use  . Smoking status: Former Smoker    Packs/day: 1.00    Types: Cigarettes  . Smokeless tobacco: Never Used  Vaping Use  . Vaping Use: Every day  Substance Use Topics  . Alcohol use: Yes    Alcohol/week: 1.0 standard drink    Types: 1 Cans of beer per week    Comment: 2-3 beers a weeek    Home Medications Prior to Admission medications   Medication Sig Start Date End Date Taking? Authorizing Provider  benzonatate (TESSALON) 100 MG capsule Take 1-2 capsules (100-200 mg total) by mouth 3 (three) times daily as needed for cough. 05/26/16   McVey, Madelaine Bhat, PA-C  Dextromethorphan-Guaifenesin (MUCINEX DM MAXIMUM STRENGTH) 60-1200 MG TB12 Take 1 tablet by mouth every 12 (twelve) hours. 05/26/16   McVey, Madelaine Bhat, PA-C  fluticasone (FLONASE) 50 MCG/ACT nasal spray Place 2 sprays into both nostrils daily. 05/26/16   McVey, Madelaine Bhat, PA-C  HYDROcodone-homatropine (HYCODAN) 5-1.5 MG/5ML syrup Take 5 mLs by mouth every 8 (eight) hours as needed for cough. 05/26/16   McVey, Madelaine Bhat, PA-C  Pseudoeph-Doxylamine-DM-APAP (  NYQUIL PO) Take by mouth.    [provider]  Pseudoephedrine-APAP-DM (DAYQUIL PO) Take by mouth.    [provider]    Allergies    Patient has no known allergies.  Review of Systems   Review of Systems  Constitutional: Negative for fever.  Respiratory: Negative for cough.   Gastrointestinal: Positive for diarrhea and vomiting. Negative for abdominal pain.  Musculoskeletal: Negative for myalgias.  Neurological: Negative for headaches.  All other systems reviewed and are negative.   Physical Exam Updated Vital Signs BP (!) 115/93   Pulse 100   Temp 97.7 F (36.5 C) (Oral)   Resp 16    Ht 5\' 11"  (1.803 m)   Wt 81.6 kg   SpO2 98%   BMI 25.10 kg/m   Physical Exam Vitals and nursing note reviewed.  Constitutional:      General: He is not in acute distress.    Appearance: Normal appearance. He is well-developed and normal weight.  HENT:     Head: Normocephalic and atraumatic.     Mouth/Throat:     Mouth: Mucous membranes are moist.  Eyes:     Conjunctiva/sclera: Conjunctivae normal.     Pupils: Pupils are equal, round, and reactive to light.  Cardiovascular:     Rate and Rhythm: Regular rhythm. Tachycardia present.     Heart sounds: No murmur heard.   Pulmonary:     Effort: Pulmonary effort is normal. No respiratory distress.     Breath sounds: Normal breath sounds. No wheezing or rales.  Abdominal:     General: There is no distension.     Palpations: Abdomen is soft.     Tenderness: There is no abdominal tenderness. There is no right CVA tenderness, left CVA tenderness, guarding or rebound.  Musculoskeletal:        General: No tenderness. Normal range of motion.     Cervical back: Normal range of motion and neck supple.  Skin:    General: Skin is warm and dry.     Capillary Refill: Capillary refill takes less than 2 seconds.     Findings: No erythema or rash.  Neurological:     Mental Status: He is alert and oriented to person, place, and time. Mental status is at baseline.  Psychiatric:        Mood and Affect: Mood normal.        Behavior: Behavior normal.     ED Results / Procedures / Treatments   Labs (all labs ordered are listed, but only abnormal results are displayed) Labs Reviewed  COMPREHENSIVE METABOLIC PANEL - Abnormal; Notable for the following components:      Result Value   Sodium 133 (*)    Chloride 91 (*)    Glucose, Bld 129 (*)    All other components within normal limits  CBC - Abnormal; Notable for the following components:   RBC 6.07 (*)    Hemoglobin 18.0 (*)    HCT 52.9 (*)    All other components within normal limits   LIPASE, BLOOD    EKG None  Radiology No results found.  Procedures Procedures   Medications Ordered in ED Medications  ondansetron (ZOFRAN-ODT) disintegrating tablet 4 mg (4 mg Oral Given 08/11/20 0739)    ED Course  I have reviewed the triage vital signs and the nursing notes.  Pertinent labs & imaging results that were available during my care of the patient were reviewed by me and considered in my medical decision making (  see chart for details).    MDM Rules/Calculators/A&P                          Patient presenting now with 4 days of intermittent vomiting.  Seems to be at night when he lays down.  Patient is also drinking large quantities of liquids and then lays down and vomits again.  He has no abdominal pain to suggest perforated viscus, appendicitis, obstruction or renal pathology.  He is overall is well-appearing with just minimal tachycardia.  He does use marijuana 1-2 times a week at the most and low suspicion for cannabis hyperemesis.  He does not use alcohol regularly and denies any other drug use.  He has had no prior abdominal surgeries.  Will check labs to ensure no significant hypokalemia.  He did receive ODT Zofran prior to being evaluated and is now feeling much better.  Will p.o. challenge to ensure he can tolerate fluids.  Encouraged him to do small frequent sips instead of drinking large quantities at a time.  Suspect most likely viral etiology versus foodborne illness.  9:38 AM Patient CMP with minimal hyponatremia of 133 but normal creatinine and anion gap.  CBC with hemoconcentration with a hemoglobin of 18 but normal white count.  Lipase within normal limits.  Went back to check on the patient and he had eloped.  Suspect this is most likely a viral illness.  Patient was able to tolerate fluids while in the room.  MDM Number of Diagnoses or Management Options   Amount and/or Complexity of Data Reviewed Clinical lab tests: ordered and reviewed Independent  visualization of images, tracings, or specimens: yes    Final Clinical Impression(s) / ED Diagnoses Final diagnoses:  Dehydration  Vomiting and diarrhea    Rx / DC Orders ED Discharge Orders    None       Gwyneth Sprout, MD 08/11/20 [phone]

## 2022-06-17 ENCOUNTER — Other Ambulatory Visit (HOSPITAL_COMMUNITY): Payer: Self-pay

## 2022-06-17 MED ORDER — AMPHETAMINE-DEXTROAMPHETAMINE 10 MG PO TABS
ORAL_TABLET | ORAL | 0 refills | Status: DC
  Filled 2022-06-17: qty 30, 16d supply, fill #0

## 2022-06-30 ENCOUNTER — Other Ambulatory Visit (HOSPITAL_COMMUNITY): Payer: Self-pay

## 2022-06-30 MED ORDER — AMPHETAMINE-DEXTROAMPHETAMINE 10 MG PO TABS
10.0000 mg | ORAL_TABLET | Freq: Two times a day (BID) | ORAL | 0 refills | Status: DC
  Filled 2022-07-05: qty 60, 30d supply, fill #0

## 2022-07-01 ENCOUNTER — Other Ambulatory Visit (HOSPITAL_COMMUNITY): Payer: Self-pay

## 2022-07-05 ENCOUNTER — Other Ambulatory Visit (HOSPITAL_COMMUNITY): Payer: Self-pay

## 2022-08-06 ENCOUNTER — Other Ambulatory Visit (HOSPITAL_COMMUNITY): Payer: Self-pay

## 2022-08-06 MED ORDER — AMPHETAMINE-DEXTROAMPHETAMINE 10 MG PO TABS
ORAL_TABLET | ORAL | 0 refills | Status: DC
  Filled 2022-08-06: qty 60, 30d supply, fill #0

## 2022-09-14 ENCOUNTER — Other Ambulatory Visit (HOSPITAL_COMMUNITY): Payer: Self-pay

## 2022-09-14 MED ORDER — AMPHETAMINE-DEXTROAMPHETAMINE 10 MG PO TABS
10.0000 mg | ORAL_TABLET | Freq: Two times a day (BID) | ORAL | 0 refills | Status: DC
  Filled 2022-09-14: qty 60, 30d supply, fill #0

## 2022-10-29 ENCOUNTER — Other Ambulatory Visit (HOSPITAL_COMMUNITY): Payer: Self-pay

## 2022-10-29 MED ORDER — AMPHETAMINE-DEXTROAMPHETAMINE 10 MG PO TABS
10.0000 mg | ORAL_TABLET | Freq: Three times a day (TID) | ORAL | 0 refills | Status: DC
  Filled 2022-10-29: qty 90, 30d supply, fill #0

## 2022-11-30 ENCOUNTER — Other Ambulatory Visit (HOSPITAL_COMMUNITY): Payer: Self-pay

## 2022-11-30 MED ORDER — AMPHETAMINE-DEXTROAMPHETAMINE 10 MG PO TABS
10.0000 mg | ORAL_TABLET | Freq: Three times a day (TID) | ORAL | 0 refills | Status: DC
  Filled 2022-11-30: qty 90, 30d supply, fill #0

## 2023-01-10 ENCOUNTER — Other Ambulatory Visit (HOSPITAL_COMMUNITY): Payer: Self-pay

## 2023-01-10 MED ORDER — AMPHETAMINE-DEXTROAMPHETAMINE 10 MG PO TABS
10.0000 mg | ORAL_TABLET | Freq: Three times a day (TID) | ORAL | 0 refills | Status: DC
  Filled 2023-01-10: qty 90, 30d supply, fill #0

## 2023-02-09 ENCOUNTER — Other Ambulatory Visit (HOSPITAL_COMMUNITY): Payer: Self-pay

## 2023-02-09 MED ORDER — AMPHETAMINE-DEXTROAMPHETAMINE 10 MG PO TABS
10.0000 mg | ORAL_TABLET | Freq: Three times a day (TID) | ORAL | 0 refills | Status: AC
  Filled 2023-02-09: qty 90, 30d supply, fill #0

## 2023-03-09 ENCOUNTER — Other Ambulatory Visit (HOSPITAL_COMMUNITY): Payer: Self-pay

## 2023-03-09 MED ORDER — AMPHETAMINE-DEXTROAMPHETAMINE 10 MG PO TABS
10.0000 mg | ORAL_TABLET | Freq: Three times a day (TID) | ORAL | 0 refills | Status: DC
  Filled 2023-03-09: qty 90, 30d supply, fill #0

## 2023-04-18 ENCOUNTER — Other Ambulatory Visit (HOSPITAL_COMMUNITY): Payer: Self-pay

## 2023-04-18 MED ORDER — AMPHETAMINE-DEXTROAMPHETAMINE 10 MG PO TABS
10.0000 mg | ORAL_TABLET | Freq: Three times a day (TID) | ORAL | 0 refills | Status: DC
  Filled 2023-04-18: qty 90, 30d supply, fill #0

## 2023-05-30 ENCOUNTER — Other Ambulatory Visit (HOSPITAL_COMMUNITY): Payer: Self-pay

## 2023-05-30 MED ORDER — AMPHETAMINE-DEXTROAMPHETAMINE 10 MG PO TABS
10.0000 mg | ORAL_TABLET | Freq: Three times a day (TID) | ORAL | 0 refills | Status: DC
  Filled 2023-08-02: qty 90, 30d supply, fill #0

## 2023-05-30 MED ORDER — AMPHETAMINE-DEXTROAMPHETAMINE 10 MG PO TABS
10.0000 mg | ORAL_TABLET | Freq: Three times a day (TID) | ORAL | 0 refills | Status: AC
  Filled 2023-05-30: qty 90, 30d supply, fill #0

## 2023-05-30 MED ORDER — AMPHETAMINE-DEXTROAMPHETAMINE 10 MG PO TABS
10.0000 mg | ORAL_TABLET | Freq: Three times a day (TID) | ORAL | 0 refills | Status: AC
  Filled 2023-07-04: qty 90, 30d supply, fill #0

## 2023-06-03 ENCOUNTER — Other Ambulatory Visit (HOSPITAL_COMMUNITY): Payer: Self-pay

## 2023-06-03 MED ORDER — HYDROXYZINE PAMOATE 25 MG PO CAPS
25.0000 mg | ORAL_CAPSULE | Freq: Three times a day (TID) | ORAL | 0 refills | Status: AC
  Filled 2023-06-03 – 2023-06-17 (×2): qty 90, 30d supply, fill #0

## 2023-06-16 ENCOUNTER — Other Ambulatory Visit (HOSPITAL_COMMUNITY): Payer: Self-pay

## 2023-06-17 ENCOUNTER — Other Ambulatory Visit (HOSPITAL_COMMUNITY): Payer: Self-pay

## 2023-07-04 ENCOUNTER — Other Ambulatory Visit (HOSPITAL_COMMUNITY): Payer: Self-pay

## 2023-08-02 ENCOUNTER — Other Ambulatory Visit (HOSPITAL_COMMUNITY): Payer: Self-pay

## 2023-09-02 ENCOUNTER — Other Ambulatory Visit (HOSPITAL_COMMUNITY): Payer: Self-pay

## 2023-09-02 MED ORDER — AMPHETAMINE-DEXTROAMPHETAMINE 10 MG PO TABS
10.0000 mg | ORAL_TABLET | Freq: Three times a day (TID) | ORAL | 0 refills | Status: DC
  Filled 2023-10-05: qty 90, 30d supply, fill #0

## 2023-09-02 MED ORDER — AMPHETAMINE-DEXTROAMPHETAMINE 10 MG PO TABS
10.0000 mg | ORAL_TABLET | Freq: Three times a day (TID) | ORAL | 0 refills | Status: AC
  Filled 2023-09-02: qty 90, 30d supply, fill #0

## 2023-10-05 ENCOUNTER — Other Ambulatory Visit (HOSPITAL_COMMUNITY): Payer: Self-pay

## 2023-11-07 ENCOUNTER — Other Ambulatory Visit (HOSPITAL_COMMUNITY): Payer: Self-pay

## 2023-11-07 MED ORDER — AMPHETAMINE-DEXTROAMPHETAMINE 10 MG PO TABS
10.0000 mg | ORAL_TABLET | Freq: Three times a day (TID) | ORAL | 0 refills | Status: DC
  Filled 2023-11-07: qty 90, 30d supply, fill #0

## 2023-12-23 ENCOUNTER — Other Ambulatory Visit (HOSPITAL_COMMUNITY): Payer: Self-pay

## 2023-12-23 MED ORDER — AMPHETAMINE-DEXTROAMPHETAMINE 10 MG PO TABS
10.0000 mg | ORAL_TABLET | Freq: Three times a day (TID) | ORAL | 0 refills | Status: AC
  Filled 2023-12-23: qty 90, 30d supply, fill #0

## 2023-12-23 MED ORDER — AMPHETAMINE-DEXTROAMPHETAMINE 10 MG PO TABS
10.0000 mg | ORAL_TABLET | Freq: Three times a day (TID) | ORAL | 0 refills | Status: DC
  Filled 2024-03-20: qty 90, 30d supply, fill #0

## 2023-12-23 MED ORDER — AMPHETAMINE-DEXTROAMPHETAMINE 10 MG PO TABS
10.0000 mg | ORAL_TABLET | Freq: Three times a day (TID) | ORAL | 0 refills | Status: AC
  Filled 2024-02-01: qty 90, 30d supply, fill #0

## 2024-02-01 ENCOUNTER — Other Ambulatory Visit (HOSPITAL_COMMUNITY): Payer: Self-pay

## 2024-03-20 ENCOUNTER — Other Ambulatory Visit (HOSPITAL_COMMUNITY): Payer: Self-pay

## 2024-05-03 ENCOUNTER — Other Ambulatory Visit (HOSPITAL_COMMUNITY): Payer: Self-pay

## 2024-05-03 MED ORDER — AMPHETAMINE-DEXTROAMPHETAMINE 10 MG PO TABS
10.0000 mg | ORAL_TABLET | Freq: Three times a day (TID) | ORAL | 0 refills | Status: AC
  Filled 2024-05-03 (×2): qty 90, 30d supply, fill #0

## 2024-05-04 ENCOUNTER — Other Ambulatory Visit (HOSPITAL_COMMUNITY): Payer: Self-pay
